# Patient Record
Sex: Male | Born: 2000 | Race: Black or African American | Hispanic: No | Marital: Single | Smoking: Never smoker
Health system: Southern US, Community
[De-identification: ages and names within clinical notes are randomized; demographics above are authoritative.]

## PROBLEM LIST (undated history)

## (undated) DIAGNOSIS — T7840XA Allergy, unspecified, initial encounter: Secondary | ICD-10-CM

## (undated) DIAGNOSIS — F909 Attention-deficit hyperactivity disorder, unspecified type: Secondary | ICD-10-CM

## (undated) HISTORY — PX: OTHER SURGICAL HISTORY: SHX169

---
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
## 2022-08-20 ENCOUNTER — Encounter (HOSPITAL_COMMUNITY): Payer: Self-pay

## 2022-08-20 ENCOUNTER — Ambulatory Visit (HOSPITAL_COMMUNITY)
Admission: EM | Admit: 2022-08-20 | Discharge: 2022-08-20 | Disposition: A | Discharge status: Home / Self Care | Payer: BC Managed Care – PPO | Attending: Physician Assistant | Admitting: Physician Assistant

## 2022-08-20 DIAGNOSIS — R002 Palpitations: Secondary | ICD-10-CM

## 2022-08-20 LAB — COMPREHENSIVE METABOLIC PANEL
ALT: 16 U/L (ref 0–44)
AST: 28 U/L (ref 15–41)
Albumin: 4.6 g/dL (ref 3.5–5.0)
Alkaline Phosphatase: 56 U/L (ref 38–126)
Anion gap: 11 (ref 5–15)
BUN: 20 mg/dL (ref 6–20)
CO2: 25 mmol/L (ref 22–32)
Calcium: 9.9 mg/dL (ref 8.9–10.3)
Chloride: 100 mmol/L (ref 98–111)
Creatinine, Ser: 1.05 mg/dL (ref 0.61–1.24)
GFR, Estimated: >60 mL/min (ref >60)
Glucose, Bld: 106 mg/dL — ABNORMAL HIGH (ref 70–99)
Potassium: 4.4 mmol/L (ref 3.5–5.1)
Sodium: 136 mmol/L (ref 135–145)
Total Bilirubin: 0.8 mg/dL (ref 0.3–1.2)
Total Protein: 7.5 g/dL (ref 6.5–8.1)

## 2022-08-20 LAB — POCT URINALYSIS DIP (MANUAL ENTRY)
Bilirubin, UA: NEGATIVE
Blood, UA: NEGATIVE
Glucose, UA: NEGATIVE mg/dL
Ketones, POC UA: NEGATIVE mg/dL
Leukocytes, UA: NEGATIVE
Nitrite, UA: NEGATIVE
Protein Ur, POC: NEGATIVE mg/dL
Spec Grav, UA: 1.025 (ref 1.010–1.025)
Urobilinogen, UA: 0.2 E.U./dL
pH, UA: 6 (ref 5.0–8.0)

## 2022-08-20 LAB — CBC
HCT: 43.7 % (ref 39.0–52.0)
Hemoglobin: 14.5 g/dL (ref 13.0–17.0)
MCH: 30.3 pg (ref 26.0–34.0)
MCHC: 33.2 g/dL (ref 30.0–36.0)
MCV: 91.2 fL (ref 80.0–100.0)
Platelets: 257 10*3/uL (ref 150–400)
RBC: 4.79 MIL/uL (ref 4.22–5.81)
RDW: 12.3 % (ref 11.5–15.5)
WBC: 5.6 10*3/uL (ref 4.0–10.5)
nRBC: 0 % (ref 0.0–0.2)

## 2022-08-20 LAB — TSH: TSH: 0.269 u[IU]/mL — ABNORMAL LOW (ref 0.350–4.500)

## 2022-08-20 NOTE — ED Provider Notes (Signed)
[facility]    CSN: [id number] Arrival date & time: 08/20/22  1211      History   Chief Complaint Chief Complaint  Patient presents with   Tachycardia    HPI Douglas Compton is a 22 y.o. male.   Patient presents today companied by his mother help provide the majority of history.  Reports that he woke up today and had a sensation of palpitations with associated mild chest discomfort and shortness of breath.  This lasted for several minutes up to a few hours but then resolved.  Reports generally feeling weak and not feeling well but denies any specific additional symptoms including cough, congestion, nausea, vomiting, chest pain, diaphoresis.  Does report that he took a CBD gummy yesterday before his symptoms began but has done this before without side effects or concern.  Denies additional medication changes or increase in caffeine consumption.  Denies past medical history including thyroid condition, anemia, arrhythmia.  Denies any bleeding including melena or hematochezia.  Denies any strenuous activity or significant water loss in the past few days.  He has been pushing fluids symptoms symptoms began with improvement.    Past Medical History:  Diagnosis Date   ADHD    Allergies     There are no problems to display for this patient.   Past Surgical History:  Procedure Laterality Date   adnoids         Home Medications    Prior to Admission medications   Medication Sig Start Date End Date Taking? Authorizing Provider  loratadine (CLARITIN) 10 MG tablet Take by mouth.   Yes [provider]  EPINEPHrine 0.3 mg/0.3 mL IJ SOAJ injection USE AS DIRECTED FOR SEVERE ALLERGIC REACTION 06/27/19   [provider]    Family History Family History  Problem Relation Age of Onset   Irregular heart beat Mother    Scoliosis Father    Bleeding Disorder Father     Social History Social History   Tobacco Use   Smoking status: Never   Smokeless  tobacco: Never  Substance Use Topics   Alcohol use: Never   Drug use: Never     Allergies   Peanut oil   Review of Systems Review of Systems  Constitutional:  Positive for activity change. Negative for appetite change, fatigue and fever.  Respiratory:  Negative for cough and shortness of breath (resolved).   Cardiovascular:  Negative for chest pain, palpitations (resolved) and leg swelling.  Gastrointestinal:  Negative for abdominal pain, diarrhea, nausea and vomiting.  Neurological:  Negative for dizziness, light-headedness and headaches.     Physical Exam Triage Vital Signs ED Triage Vitals  Enc Vitals Group     BP 08/20/22 1322 116/72     Pulse Rate 08/20/22 1322 (!) 104     Resp 08/20/22 1322 17     Temp 08/20/22 1322 98.2 F (36.8 C)     Temp Source 08/20/22 1322 Oral     SpO2 08/20/22 1322 98 %     Weight 08/20/22 1322 117 lb (53.1 kg)     Height 08/20/22 1322 5\' 7"  (1.702 m)     Head Circumference --      Peak Flow --      Pain Score 08/20/22 1321 0     Pain Loc --      Pain Edu? --      Excl. in [place]? --    No data found.  Updated Vital Signs BP 112/67 (BP Location:  Left Arm)   Pulse 73   Temp 98.2 F (36.8 C) (Oral)   Resp 16   Ht 5\' 7"  (1.702 m)   Wt 117 lb (53.1 kg)   SpO2 100%   BMI 18.32 kg/m   Visual Acuity Right Eye Distance:   Left Eye Distance:   Bilateral Distance:    Right Eye Near:   Left Eye Near:    Bilateral Near:     Physical Exam Vitals reviewed.  Constitutional:      General: He is awake.     Appearance: Normal appearance. He is well-developed. He is not ill-appearing.     Comments: Very pleasant male appears stated age in no acute distress sitting comfortably in exam room  HENT:     Head: Normocephalic and atraumatic.     Mouth/Throat:     Pharynx: No oropharyngeal exudate, posterior oropharyngeal erythema or uvula swelling.  Cardiovascular:     Rate and Rhythm: Normal rate and regular rhythm.     Heart sounds:  Normal heart sounds, S1 normal and S2 normal. No murmur heard. Pulmonary:     Effort: Pulmonary effort is normal.     Breath sounds: Normal breath sounds. No stridor. No wheezing, rhonchi or rales.     Comments: Clear to auscultation bilaterally Abdominal:     General: Bowel sounds are normal.     Palpations: Abdomen is soft.     Tenderness: There is no abdominal tenderness.  Neurological:     Mental Status: He is alert.  Psychiatric:        Behavior: Behavior is cooperative.      UC Treatments / Results  Labs (all labs ordered are listed, but only abnormal results are displayed) Labs Reviewed  COMPREHENSIVE METABOLIC PANEL  CBC  TSH  POCT URINALYSIS DIP (MANUAL ENTRY)    EKG   Radiology No results found.  Procedures Procedures (including critical care time)  Medications Ordered in UC Medications - No data to display  Initial Impression / Assessment and Plan / UC Course  I have reviewed the triage vital signs and the nursing notes.  Pertinent labs & imaging results that were available during my care of the patient were reviewed by me and considered in my medical decision making (see chart for details).     Patient is well-appearing, afebrile, nontoxic.  He was initially tachycardic on intake but this resolved by the time we had EKG performed.  EKG was performed given palpitations that showed normal sinus rhythm with ventricular rate of 69 bpm without ischemic changes; no previous to compare.  UA was obtained that was normal with no evidence of dehydration.  We discussed that his symptoms could be related to dehydration that is since resolved now that he has been pushing fluids.  Encouraged him to continue pushing fluids since this has provided relief of symptoms.  Basic labs including CBC, CMP, TSH were obtained.  Discussed that if he has any recurrent episodes he should follow-up with a cardiologist to consider Holter monitor and was given contact information for local  provider with instruction to call to schedule an appointment.  Discussed that if he has any severe symptoms including palpitations, chest pain, shortness of breath, weakness, syncope he needs to go to the emergency room.  Strict return precautions given.  Patient declined excuse note.  Final Clinical Impressions(s) / UC Diagnoses   Final diagnoses:  Palpitations     Discharge Instructions      Your EKG was reassuring.  You are not dehydrated.  I do suspect that you might have been dehydrated yesterday and this contributed to symptoms.  Make sure that you are drinking plenty of fluid.  We will contact you if lab work is abnormal.  If you have recurrent symptoms please call and schedule appoint with cardiology.  If you have any worsening symptoms including severe palpitations that lasts for more than a few minutes, chest pain, shortness of breath, weakness, passing out you need to go to the emergency room.     ED Prescriptions   None    PDMP not reviewed this encounter.   Jeani Hawking, PA-C 08/20/22 1513

## 2022-08-20 NOTE — ED Triage Notes (Signed)
Patient here today with c/o rapid heart rate X 1 hour. He has also been having some weakness today. He has been trying to relax since it started. Seems to have improved some but still feels like it's beating fast and still having weakness. Prior to the symptoms, he was lying down and got up to use the bathroom when it started. He states that his chest did hurt a little when it first started.

## 2022-08-20 NOTE — Discharge Instructions (Signed)
Your EKG was reassuring.  You are not dehydrated.  I do suspect that you might have been dehydrated yesterday and this contributed to symptoms.  Make sure that you are drinking plenty of fluid.  We will contact you if lab work is abnormal.  If you have recurrent symptoms please call and schedule appoint with cardiology.  If you have any worsening symptoms including severe palpitations that lasts for more than a few minutes, chest pain, shortness of breath, weakness, passing out you need to go to the emergency room.

## 2022-08-30 ENCOUNTER — Encounter: Payer: Self-pay | Admitting: General Practice

## 2022-09-23 ENCOUNTER — Emergency Department (HOSPITAL_COMMUNITY)
Admission: EM | Admit: 2022-09-23 | Discharge: 2022-09-23 | Disposition: A | Discharge status: Home / Self Care | Payer: BC Managed Care – PPO | Attending: Emergency Medicine | Admitting: Emergency Medicine

## 2022-09-23 ENCOUNTER — Encounter (HOSPITAL_COMMUNITY): Payer: Self-pay

## 2022-09-23 ENCOUNTER — Other Ambulatory Visit: Payer: Self-pay

## 2022-09-23 ENCOUNTER — Emergency Department (HOSPITAL_COMMUNITY): Payer: BC Managed Care – PPO

## 2022-09-23 DIAGNOSIS — R079 Chest pain, unspecified: Secondary | ICD-10-CM | POA: Diagnosis not present

## 2022-09-23 DIAGNOSIS — R002 Palpitations: Secondary | ICD-10-CM | POA: Insufficient documentation

## 2022-09-23 LAB — BASIC METABOLIC PANEL
Anion gap: 10 (ref 5–15)
BUN: 20 mg/dL (ref 6–20)
CO2: 24 mmol/L (ref 22–32)
Calcium: 9.3 mg/dL (ref 8.9–10.3)
Chloride: 103 mmol/L (ref 98–111)
Creatinine, Ser: 1.18 mg/dL (ref 0.61–1.24)
GFR, Estimated: >60 mL/min (ref >60)
Glucose, Bld: 151 mg/dL — ABNORMAL HIGH (ref 70–99)
Potassium: 3.5 mmol/L (ref 3.5–5.1)
Sodium: 137 mmol/L (ref 135–145)

## 2022-09-23 LAB — CBC
HCT: 42.5 % (ref 39.0–52.0)
Hemoglobin: 14.3 g/dL (ref 13.0–17.0)
MCH: 29.9 pg (ref 26.0–34.0)
MCHC: 33.6 g/dL (ref 30.0–36.0)
MCV: 88.9 fL (ref 80.0–100.0)
Platelets: 237 10*3/uL (ref 150–400)
RBC: 4.78 MIL/uL (ref 4.22–5.81)
RDW: 12.1 % (ref 11.5–15.5)
WBC: 10.8 10*3/uL — ABNORMAL HIGH (ref 4.0–10.5)
nRBC: 0 % (ref 0.0–0.2)

## 2022-09-23 LAB — MAGNESIUM: Magnesium: 2 mg/dL (ref 1.7–2.4)

## 2022-09-23 LAB — TSH: TSH: 1.893 u[IU]/mL (ref 0.350–4.500)

## 2022-09-23 LAB — TROPONIN I (HIGH SENSITIVITY): Troponin I (High Sensitivity): 9 ng/L (ref <18)

## 2022-09-23 MED ORDER — NAPROXEN 250 MG PO TABS
500.0000 mg | ORAL_TABLET | Freq: Once | ORAL | Status: AC
  Administered 2022-09-23: 500 mg via ORAL
  Filled 2022-09-23: qty 2

## 2022-09-23 MED ORDER — NAPROXEN 500 MG PO TABS: 500.0000 mg | ORAL_TABLET | Freq: Two times a day (BID) | ORAL | 0 refills | Status: DC

## 2022-09-23 NOTE — ED Triage Notes (Signed)
Pt reports heart palpitations woke him from sleep

## 2022-09-23 NOTE — Discharge Instructions (Addendum)
You were evaluated in the Emergency Department and after careful evaluation, we did not find any emergent condition requiring admission or further testing in the hospital.  Your exam/testing today is overall reassuring.  Follow-up with your primary care doctor to discuss your thyroid numbers.  Follow-up with cardiology to discuss your symptoms.  Use the Naprosyn anti-inflammatory twice daily for pain.  Please return to the Emergency Department if you experience any worsening of your condition.   Thank you for allowing [place] to be a part of your care.

## 2022-09-23 NOTE — ED Provider Notes (Signed)
  Physical Exam  BP 124/78   Pulse 69   Temp 98.3 F (36.8 C)   Resp 11   Ht 5\' 6"  (1.676 m)   Wt 54.4 kg   SpO2 100%   BMI 19.37 kg/m   Physical Exam  Procedures  Procedures  ED Course / MDM     Received care of patient from Dr. Orland Dec.  Please see his note for prior history, physical and care.  This is a 22 year old male who presents with concern for palpitations and chest pain.  EKG completed and personally abided by me and Dr. Orland Dec shows no evidence of acute arrhythmia.  Chest x-ray completed and evaluated by radiology shows no evidence of pneumonia, pneumothorax, cardiomegaly, or pulmonary edema.  He has normal bilateral upper and lower extremity pulses, low clinical suspicion for aortic dissection by history and exam.  He is low risk Wells and PERC negative and have low suspicion for pulmonary embolus.  Labs are completed and personally about interpreted by me show no significant electrolyte abnormalities, no anemia, TSH was completed which was within normal limits, and improved from June 1.  Troponin 9 after more than 6 hours of symptoms and have low suspicion for myocarditis or ACS.  Recommend continued follow-up with primary care doctor, and following up with cardiology as has already been scheduled.     Alvira Monday, MD 09/23/22 [phone]

## 2022-09-23 NOTE — ED Provider Notes (Signed)
[facility] Emergency Department Provider Note MRN:  [id number]  Arrival date & time: 09/23/22     Chief Complaint   Palpitations   History of Present Illness   Douglas Compton is a 22 y.o. year-old male with no pertinent past medical history presenting to the ED with chief complaint of palpitations.  Continued palpitations over the past month, getting more frequent.  Also having central sharp chest pain on occasion, also with occasional left shoulder pain.  Not worse with a deep breath.  Seems to be more common when laying flat at night, sometimes worse with activities and movement.  Denies any leg pain or swelling, no shortness of breath, no abdominal pain, no recent drug use.  Review of Systems  A thorough review of systems was obtained and all systems are negative except as noted in the HPI and PMH.   Patient's Health History    Past Medical History:  Diagnosis Date   ADHD    Allergies     Past Surgical History:  Procedure Laterality Date   adnoids      Family History  Problem Relation Age of Onset   Irregular heart beat Mother    Scoliosis Father    Bleeding Disorder Father     Social History   Socioeconomic History   Marital status: Single    Spouse name: Not on file   Number of children: Not on file   Years of education: Not on file   Highest education level: Not on file  Occupational History   Not on file  Tobacco Use   Smoking status: Never   Smokeless tobacco: Never  Substance and Sexual Activity   Alcohol use: Never   Drug use: Never   Sexual activity: Never  Other Topics Concern   Not on file  Social History Narrative   Not on file   Social Determinants of Health   Financial Resource Strain: Not on file  Food Insecurity: Not on file  Transportation Needs: Not on file  Physical Activity: Not on file  Stress: Not on file  Social Connections: Not on file  Intimate Partner Violence: Not on file     Physical Exam    Vitals:   09/23/22 0555 09/23/22 0558  BP: (!) 142/85   Pulse: 64   Resp:  14  Temp:    SpO2: 100%     CONSTITUTIONAL: Well-appearing, NAD NEURO/PSYCH:  Alert and oriented x 3, no focal deficits EYES:  eyes equal and reactive ENT/NECK:  no LAD, no JVD CARDIO: Regular rate, well-perfused, normal S1 and S2 PULM:  CTAB no wheezing or rhonchi GI/GU:  non-distended, non-tender MSK/SPINE:  No gross deformities, no edema SKIN:  no rash, atraumatic   *Additional and/or pertinent findings included in MDM below  Diagnostic and Interventional Summary    EKG Interpretation Date/Time:  Friday September 23 2022 05:21:37 EDT Ventricular Rate:  85 PR Interval:  146 QRS Duration:  96 QT Interval:  366 QTC Calculation: 435 R Axis:   85  Text Interpretation: Sinus rhythm with marked sinus arrhythmia Right atrial enlargement Borderline ECG When compared with ECG of 20-Aug-2022 14:12, PREVIOUS ECG IS PRESENT Confirmed by Kennis Carina [phone]) on 09/23/2022 6:12:38 AM       Labs Reviewed  CBC - Abnormal; Notable for the following components:      Result Value   WBC 10.8 (*)    All other components within normal limits  BASIC METABOLIC PANEL  TSH  MAGNESIUM  DG Chest 2 View    (Results Pending)    Medications  naproxen (NAPROSYN) tablet 500 mg (500 mg Oral Given 09/23/22 1610)     Procedures  /  Critical Care Procedures  ED Course and Medical Decision Making  Initial Impression and Ddx Continued palpitations, was seen at urgent care last month with basic labs drawn that were overall reassuring.  He had a minimally abnormal low TSH level and so hyperthyroidism is considered.  Other considerations include electrolyte disturbance, arrhythmia, stress, regarding the pain favoring either costochondritis or GERD.  No fever, pain is very atypical.  Pneumothorax considered as well.  PERC negative, highly doubt PE, highly doubt dissection or any other emergent cardiopulmonary process.  Past  medical/surgical history that increases complexity of ED encounter: None  Interpretation of Diagnostics I personally reviewed the EKG and my interpretation is as follows: Sinus rhythm  Labs pending  Patient Reassessment and Ultimate Disposition/Management     Anticipating discharge if labs reassuring and patient remains in sinus rhythm here in the emergency department.  They already have follow-up with PCP and cardiology.  Signed out to oncoming provider at shift change.  Patient management required discussion with the following services or consulting groups:  None  Complexity of Problems Addressed Acute illness or injury that poses threat of life of bodily function  Additional Data Reviewed and Analyzed Further history obtained from: Further history from spouse/family member  Additional Factors Impacting ED Encounter Risk None  Elmer Sow. Pilar Plate, MD [facility] Emergency Medicine [facility] [email or web address]  Final Clinical Impressions(s) / ED Diagnoses     ICD-10-CM   1. Palpitations  R00.2     2. Chest pain, unspecified type  R07.9       ED Discharge Orders          Ordered    naproxen (NAPROSYN) 500 MG tablet  2 times daily        09/23/22 9604             Discharge Instructions Discussed with and Provided to Patient:     Discharge Instructions      You were evaluated in the Emergency Department and after careful evaluation, we did not find any emergent condition requiring admission or further testing in the hospital.  Your exam/testing today is overall reassuring.  Follow-up with your primary care doctor to discuss your thyroid numbers.  Follow-up with cardiology to discuss your symptoms.  Use the Naprosyn anti-inflammatory twice daily for pain.  Please return to the Emergency Department if you experience any worsening of your condition.   Thank you for allowing [place] to be a part of your care.       Sabas Sous, MD 09/23/22  [phone]

## 2022-09-23 NOTE — ED Triage Notes (Signed)
Pt reports that he has been having palpitations for the past "few weeks" . Pt reports feeling light headed today which is new and having heart palpitations. Pt episodes of palpitations are increasing in frequency.

## 2022-10-19 ENCOUNTER — Encounter: Payer: Self-pay | Admitting: Internal Medicine

## 2022-10-19 ENCOUNTER — Ambulatory Visit: Payer: BC Managed Care – PPO | Attending: Internal Medicine | Admitting: Internal Medicine

## 2022-10-19 ENCOUNTER — Ambulatory Visit (INDEPENDENT_AMBULATORY_CARE_PROVIDER_SITE_OTHER): Payer: BC Managed Care – PPO

## 2022-10-19 VITALS — BP 108/66 | HR 62 | Ht 66.0 in | Wt 119.0 lb

## 2022-10-19 DIAGNOSIS — R002 Palpitations: Secondary | ICD-10-CM

## 2022-10-19 NOTE — Patient Instructions (Signed)
Medication Instructions:   *If you need a refill on your cardiac medications before your next appointment, please call your pharmacy*   Lab Work:  If you have labs (blood work) drawn today and your tests are completely normal, you will receive your results only by: MyChart Message (if you have MyChart) OR A paper copy in the mail If you have any lab test that is abnormal or we need to change your treatment, we will call you to review the results.   Testing/Procedures:  Douglas Compton- Long Term Monitor Instructions  Your physician has requested you wear a ZIO patch monitor for 14 days.  This is a single patch monitor. [employer] supplies one patch monitor per enrollment. Additional stickers are not available. Please do not apply patch if you will be having a Nuclear Stress Test,  Echocardiogram, Cardiac CT, MRI, or Chest Xray during the period you would be wearing the  monitor. The patch cannot be worn during these tests. You cannot remove and re-apply the  ZIO XT patch monitor.  Your ZIO patch monitor will be mailed 3 day USPS to your address on file. It may take 3-5 days  to receive your monitor after you have been enrolled.  Once you have received your monitor, please review the enclosed instructions. Your monitor  has already been registered assigning a specific monitor serial # to you.  Billing and Patient Assistance Program Information  We have supplied [employer] with any of your insurance information on file for billing purposes. [employer] offers a sliding scale Patient Assistance Program for patients that do not have  insurance, or whose insurance does not completely cover the cost of the ZIO monitor.  You must apply for the Patient Assistance Program to qualify for this discounted rate.  To apply, please call [employer] at [phone], select option 4, select option 2, ask to apply for  Patient Assistance Program. Meredeth Ide will ask your household income, and how many people  are in your  household. They will quote your out-of-pocket cost based on that information.  [employer] will also be able to set up a 75-month, interest-free payment plan if needed.  Applying the monitor   Shave hair from upper left chest.  Hold abrader disc by orange tab. Rub abrader in 40 strokes over the upper left chest as  indicated in your monitor instructions.  Clean area with 4 enclosed alcohol pads. Let dry.  Apply patch as indicated in monitor instructions. Patch will be placed under collarbone on left  side of chest with arrow pointing upward.  Rub patch adhesive wings for 2 minutes. Remove white label marked "1". Remove the white  label marked "2". Rub patch adhesive wings for 2 additional minutes.  While looking in a mirror, press and release button in center of patch. A small green light will  flash 3-4 times. This will be your only indicator that the monitor has been turned on.  Do not shower for the first 24 hours. You may shower after the first 24 hours.  Press the button if you feel a symptom. You will hear a small click. Record Date, Time and  Symptom in the Patient Logbook.  When you are ready to remove the patch, follow instructions on the last 2 pages of Patient  Logbook. Stick patch monitor onto the last page of Patient Logbook.  Place Patient Logbook in the blue and white box. Use locking tab on box and tape box closed  securely. The blue and white box has  prepaid postage on it. Please place it in the mailbox as  soon as possible. Your physician should have your test results approximately 7 days after the  monitor has been mailed back to [facility].  Call [facility] Customer Care at [phone] if you have questions regarding  your ZIO XT patch monitor. Call them immediately if you see an orange light blinking on your  monitor.  If your monitor falls off in less than 4 days, contact our Monitor department at [phone].  If your monitor becomes loose or falls off after  4 days call [employer] at [phone] for  suggestions on securing your monitor    Follow-Up: At [facility], you and your health needs are our priority.  As part of our continuing mission to provide you with exceptional heart care, we have created designated Provider Care Teams.  These Care Teams include your primary Cardiologist (physician) and Advanced Practice Providers (APPs -  Physician Assistants and Nurse Practitioners) who all work together to provide you with the care you need, when you need it.  We recommend signing up for the patient portal called "MyChart".  Sign up information is provided on this After Visit Summary.  MyChart is used to connect with patients for Virtual Visits (Telemedicine).  Patients are able to view lab/test results, encounter notes, upcoming appointments, etc.  Non-urgent messages can be sent to your provider as well.   To learn more about what you can do with MyChart, go to [email or web address].

## 2022-10-19 NOTE — Progress Notes (Unsigned)
Enrolled for [employer] to mail a ZIO XT long term holter monitor to the patients address on file.  

## 2022-10-19 NOTE — Progress Notes (Signed)
Cardiology Office Note   Date:  10/19/2022   ID:  Douglas Compton, DOB [birth year], MRN [id number]  PCP:  Velvet Bathe, MD  Cardiologist:   Dietrich Pates, MD   Patient presents for    History of Present Illness: Douglas Compton is a 22 y.o. male with a history of palpitations   The pt says the spells started in June 2024  He woke up one day  Was laying in bed when his heart started racing.   Associated with SOB   WEnt to ER   There in SR  Sent home  Second episode woke up from sleep with his heart already beating fast  Says his L arm hurt, he was SOB  Also complained of stomach pain.    Went to ER EKG SRLabs, XRay negative    Given naprosyn   Episode  Eased off      Since then he says he continues to have spells   Random   Associated with tightness in chest  Some lightheadedness but no syncope      When not having a spell feels OK  Active  LIfts weights at gym  Does not do aerobic exercise       Current Meds  Medication Sig   EPINEPHrine 0.3 mg/0.3 mL IJ SOAJ injection USE AS DIRECTED FOR SEVERE ALLERGIC REACTION   loratadine (CLARITIN) 10 MG tablet Take by mouth.   naproxen (NAPROSYN) 500 MG tablet Take 1 tablet (500 mg total) by mouth 2 (two) times daily.     Allergies:   Fish allergy and Peanut oil   Past Medical History:  Diagnosis Date   ADHD    Allergies     Past Surgical History:  Procedure Laterality Date   adnoids       Social History:  The patient  reports that he has never smoked. He has never used smokeless tobacco. He reports that he does not drink alcohol and does not use drugs.   Family History:  The patient's family history includes Bleeding Disorder in his father; Irregular heart beat in his mother; Scoliosis in his father.    ROS:  Please see the history of present illness. All other systems are reviewed and  Negative to the above problem except as noted.    PHYSICAL EXAM: VS:  BP 108/66   Pulse 62   Ht 5\' 6"  (1.676 m)   Wt 119 lb (54 kg)    SpO2 99%   BMI 19.21 kg/m   GEN: Thin 21 yoin no acute distress  HEENT: normal  Neck: no JVD, carotid bruit Cardiac: RRR; no murmur  No LE edema  Respiratory:  clear to auscultation bilaterally GI: soft, nontender, nondistended No hepatomegaly  MS: no deformity Moving all extremities   Skin: warm and dry, no rash Neuro:  Strength and sensation are intact Psych: euthymic mood, full affect   EKG:  EKG is not ordered today.   Lipid Panel No results found for: "CHOL", "TRIG", "HDL", "CHOLHDL", "VLDL", "LDLCALC", "LDLDIRECT"    Wt Readings from Last 3 Encounters:  10/19/22 119 lb (54 kg) (3%, Z= -1.87)*  09/23/22 120 lb (54.4 kg) (4%, Z= -1.80)*  08/20/22 117 lb (53.1 kg) (2%, Z= -1.99)*   * Growth percentiles are based on CDC (Boys, 2-20 Years) data.      ASSESSMENT AND PLAN:  1  Tachycardia  PT with onset of tachypalpitations over te past couple months   Associated with some chest discomfort, some  SOB, some lightheadedness   On 2 ER visits no arrhythmia documented Will set up for a Zio patch to try to capture     Keep active     Avoid stimulants  Stay hydrated.  Follow up based on results    Current medicines are reviewed at length with the patient today.  The patient does not have concerns regarding medicines.  Signed, Dietrich Pates, MD  10/19/2022 3:26 PM    [facility] [address] [place], [place], [place]  [address] Phone: [phone]; Fax: [phone]

## 2022-11-04 DIAGNOSIS — R002 Palpitations: Secondary | ICD-10-CM

## 2022-12-02 ENCOUNTER — Ambulatory Visit: Payer: BC Managed Care – PPO | Admitting: Family Medicine

## 2022-12-08 ENCOUNTER — Ambulatory Visit: Payer: BC Managed Care – PPO | Admitting: Family Medicine

## 2022-12-08 ENCOUNTER — Encounter: Payer: Self-pay | Admitting: Family Medicine

## 2022-12-08 VITALS — BP 100/80 | HR 62 | Temp 97.8°F | Ht 66.0 in | Wt 120.2 lb

## 2022-12-08 DIAGNOSIS — L309 Dermatitis, unspecified: Secondary | ICD-10-CM

## 2022-12-08 DIAGNOSIS — R002 Palpitations: Secondary | ICD-10-CM | POA: Insufficient documentation

## 2022-12-08 DIAGNOSIS — R21 Rash and other nonspecific skin eruption: Secondary | ICD-10-CM | POA: Diagnosis not present

## 2022-12-08 DIAGNOSIS — Z91010 Allergy to peanuts: Secondary | ICD-10-CM | POA: Diagnosis not present

## 2022-12-08 DIAGNOSIS — J302 Other seasonal allergic rhinitis: Secondary | ICD-10-CM | POA: Diagnosis not present

## 2022-12-08 NOTE — Assessment & Plan Note (Signed)
Well controlled with lidex cream prn falres.

## 2022-12-08 NOTE — Assessment & Plan Note (Signed)
Acute irritation at piercing on the right earlobe, no redness heat or discharge.  Improving some.  Appears most consistent with nickel allergy.  Recommending applying topical steroid twice daily.  Call if redness or discharge occurs.

## 2022-12-08 NOTE — Progress Notes (Signed)
Patient ID: Douglas Compton, male    DOB: Sep 09, 2000, 22 y.o.   MRN: [id number]  This visit was conducted in person.  BP 100/80   Pulse 62   Temp 97.8 F (36.6 C) (Temporal)   Ht 5\' 6"  (1.676 m)   Wt 120 lb 3.2 oz (54.5 kg)   SpO2 98%   BMI 19.40 kg/m    CC:  Chief Complaint  Patient presents with   Establish Care    Pt would like to discuss swelling in right ear. 4 days.     Subjective:   HPI: Douglas Compton is a 22 y.o. male presenting on 12/08/2022 for Establish Care (Pt would like to discuss swelling in right ear. 4 days. )   Previous PCP: Dr. Sheliah Hatch Last CPX: years     Reviewed ER visit for chest tightness, SOB and racing.  Feels better with  prilosec as needed... occ burning in chest after eating.  Followed by Dr. Tenny Craw cardiology for palpitations Reviewed last office visit note October 19, 2022   Zio monitor showed sinus tach.. recommended ECHO.   Exercise: once a week at gym.  Diet: moderate fast food.   New onset right ear pain red and warm, painful.  Applied neosporin, cold compresses and saline solutions.  40 % better.  No fever.    Relevant past medical, surgical, family and social history reviewed and updated as indicated. Interim medical history since our last visit reviewed. Allergies and medications reviewed and updated. Outpatient Medications Prior to Visit  Medication Sig Dispense Refill   EPINEPHrine 0.3 mg/0.3 mL IJ SOAJ injection USE AS DIRECTED FOR SEVERE ALLERGIC REACTION     fluocinonide cream (LIDEX) 0.05 % Apply 1 Application topically 2 (two) times daily.     loratadine (CLARITIN) 10 MG tablet Take by mouth.     naproxen (NAPROSYN) 500 MG tablet Take 1 tablet (500 mg total) by mouth 2 (two) times daily. 30 tablet 0   No facility-administered medications prior to visit.     Per HPI unless specifically indicated in ROS section below Review of Systems  Constitutional:  Negative for fatigue and fever.  HENT:  Negative for ear pain.    Eyes:  Negative for pain.  Respiratory:  Negative for cough and shortness of breath.   Cardiovascular:  Negative for chest pain, palpitations and leg swelling.  Gastrointestinal:  Negative for abdominal pain.  Genitourinary:  Negative for dysuria.  Musculoskeletal:  Negative for arthralgias.  Neurological:  Negative for syncope, light-headedness and headaches.  Psychiatric/Behavioral:  Negative for dysphoric mood.    Objective:  BP 100/80   Pulse 62   Temp 97.8 F (36.6 C) (Temporal)   Ht 5\' 6"  (1.676 m)   Wt 120 lb 3.2 oz (54.5 kg)   SpO2 98%   BMI 19.40 kg/m   Wt Readings from Last 3 Encounters:  12/08/22 120 lb 3.2 oz (54.5 kg) (4%, Z= -1.80)*  10/19/22 119 lb (54 kg) (3%, Z= -1.87)*  09/23/22 120 lb (54.4 kg) (4%, Z= -1.80)*   * Growth percentiles are based on CDC (Boys, 2-20 Years) data.      Physical Exam Constitutional:      Appearance: He is well-developed.  HENT:     Head: Normocephalic.     Right Ear: Hearing normal.     Left Ear: Hearing normal.     Ears:     Comments: Right external ear above piercing yet small amount of hyperpigmentation  Nose: Nose normal.     Mouth/Throat:     Mouth: Oropharynx is clear and moist and mucous membranes are normal.  Neck:     Thyroid: No thyroid mass or thyromegaly.     Vascular: No carotid bruit.     Trachea: Trachea normal.  Cardiovascular:     Rate and Rhythm: Normal rate and regular rhythm.     Pulses: Normal pulses.     Heart sounds: Heart sounds not distant. No murmur heard.    No friction rub. No gallop.     Comments: No peripheral edema Pulmonary:     Effort: Pulmonary effort is normal. No respiratory distress.     Breath sounds: Normal breath sounds.  Skin:    General: Skin is warm, dry and intact.     Findings: No rash.  Psychiatric:        Mood and Affect: Mood and affect normal.        Speech: Speech normal.        Behavior: Behavior normal.        Thought Content: Thought content normal.      Results for orders placed or performed during the hospital encounter of 09/23/22  Basic metabolic panel  Result Value Ref Range   Sodium 137 135 - 145 mmol/L   Potassium 3.5 3.5 - 5.1 mmol/L   Chloride 103 98 - 111 mmol/L   CO2 24 22 - 32 mmol/L   Glucose, Bld 151 (H) 70 - 99 mg/dL   BUN 20 6 - 20 mg/dL   Creatinine, Ser 4.09 0.61 - 1.24 mg/dL   Calcium 9.3 8.9 - 81.1 mg/dL   GFR, Estimated >91 >47 mL/min   Anion gap 10 5 - 15  CBC  Result Value Ref Range   WBC 10.8 (H) 4.0 - 10.5 K/uL   RBC 4.78 4.22 - 5.81 MIL/uL   Hemoglobin 14.3 13.0 - 17.0 g/dL   HCT 82.9 56.2 - 13.0 %   MCV 88.9 80.0 - 100.0 fL   MCH 29.9 26.0 - 34.0 pg   MCHC 33.6 30.0 - 36.0 g/dL   RDW 86.5 78.4 - 69.6 %   Platelets 237 150 - 400 K/uL   nRBC 0.0 0.0 - 0.2 %  TSH  Result Value Ref Range   TSH 1.893 0.350 - 4.500 uIU/mL  Magnesium  Result Value Ref Range   Magnesium 2.0 1.7 - 2.4 mg/dL  Troponin I (High Sensitivity)  Result Value Ref Range   Troponin I (High Sensitivity) 9 <18 ng/L    Assessment and Plan  Seasonal allergies  Peanut allergy  Eczema, unspecified type Assessment & Plan:  Well controlled with lidex cream prn falres.   Skin rash Assessment & Plan: Acute irritation at piercing on the right earlobe, no redness heat or discharge.  Improving some.  Appears most consistent with nickel allergy.  Recommending applying topical steroid twice daily.  Call if redness or discharge occurs.   Palpitations    No follow-ups on file.   Kerby Nora, MD

## 2022-12-19 ENCOUNTER — Other Ambulatory Visit: Payer: Self-pay

## 2022-12-19 DIAGNOSIS — R002 Palpitations: Secondary | ICD-10-CM

## 2023-01-06 ENCOUNTER — Other Ambulatory Visit (HOSPITAL_COMMUNITY): Payer: BC Managed Care – PPO

## 2023-01-13 ENCOUNTER — Ambulatory Visit (HOSPITAL_COMMUNITY): Payer: BC Managed Care – PPO | Attending: Internal Medicine

## 2023-01-13 DIAGNOSIS — R002 Palpitations: Secondary | ICD-10-CM | POA: Insufficient documentation

## 2023-01-13 DIAGNOSIS — R0602 Shortness of breath: Secondary | ICD-10-CM

## 2023-01-13 LAB — ECHOCARDIOGRAM COMPLETE
Area-P 1/2: 3.36 cm2
S' Lateral: 2.4 cm

## 2023-01-17 ENCOUNTER — Telehealth: Payer: Self-pay

## 2023-01-17 NOTE — Telephone Encounter (Signed)
I went over the pts Echo and monitor results and he verbalized understanding... he says his palpitations and any feeling of a racing heart beat has much improved.. he will continue to monitor and let us know how he is doing but he prefers to hold off on the beta blocker for now but will let us know if he would like [place] to send them in.

## 2023-01-17 NOTE — Telephone Encounter (Signed)
-----   Message from [place] sent at 01/16/2023  9:11 PM EDT ----- Echo shows heart is structurally normal Normal function of ventricles Normal valve function Is she still having palpitaitons/tachycardia ?  Monitor showed no signficant arrhythmias but pt could try low dose Toprol XL 12.5-25 mg    Stay hydrated

## 2023-12-11 ENCOUNTER — Encounter: Payer: Self-pay | Admitting: *Deleted

## 2023-12-12 ENCOUNTER — Ambulatory Visit (INDEPENDENT_AMBULATORY_CARE_PROVIDER_SITE_OTHER): Payer: BC Managed Care – PPO | Admitting: Family Medicine

## 2023-12-12 ENCOUNTER — Encounter: Payer: Self-pay | Admitting: Family Medicine

## 2023-12-12 VITALS — BP 110/70 | HR 93 | Temp 99.0°F | Ht 66.0 in | Wt 122.1 lb

## 2023-12-12 DIAGNOSIS — N4889 Other specified disorders of penis: Secondary | ICD-10-CM | POA: Diagnosis not present

## 2023-12-12 DIAGNOSIS — J392 Other diseases of pharynx: Secondary | ICD-10-CM | POA: Diagnosis not present

## 2023-12-12 DIAGNOSIS — Z0000 Encounter for general adult medical examination without abnormal findings: Secondary | ICD-10-CM | POA: Insufficient documentation

## 2023-12-12 DIAGNOSIS — Z113 Encounter for screening for infections with a predominantly sexual mode of transmission: Secondary | ICD-10-CM

## 2023-12-12 NOTE — Assessment & Plan Note (Signed)
 Acute, no lesion on exam. Most likely abrasion.  Will evaluate with HSV testing.  Of note patient with recent STI testing.  Will obtain from health department.

## 2023-12-12 NOTE — Assessment & Plan Note (Addendum)
 Patient with recurrent intermittent erythematous raised bumps in posterior oropharynx.  No ulcers.  Appearance most consistent with associated changes with chronic post nasal drip or viral infection. Patient had neck negative strep testing  at recent urgent care visit . Recommend Flonase 2 sprays per nostril daily and continuation of Zyrtec.  Less likely but also possible is gastroesophageal reflux as cause of irritation in throat.  Recommend decrease acid triggers in diet.  Continue proton pump inhibitor daily for 4 to 6 weeks.  Follow-up if not improving as expected.

## 2023-12-12 NOTE — Progress Notes (Signed)
 Patient ID: Douglas Compton, male    DOB: 08-27-2000, 23 y.o.   MRN: [id number]  This visit was conducted in person.  BP 110/70   Pulse 93   Temp 99 F (37.2 C) (Temporal)   Ht 5' 6 (1.676 m)   Wt 122 lb 2 oz (55.4 kg)   SpO2 98%   BMI 19.71 kg/m    CC:  Chief Complaint  Patient presents with   Annual Exam    Subjective:   HPI: The patient presents for complete physical and review of chronic health problems. He/She also has the following acute concerns today:  ST has been bothering him  for 1 month . Off and on.  No pain to swallow.  Always with PND, bad allergies causing nasal congestion. using Zyrtec , minimally helpful.  No nasal spray.  Has reflux once a day... using prilosec or antacids prn.  No cough.    No fever,    9/10 UC visit Tested for strep, GC/ Chlam  STI screening full at health Dept... will request records. Does have a slightly sore area that feel like it was irritaiton due to friction.                    New  male partner since 09/2023  Followed by Dr. Okey cardiology for palpitations  Zio monitor showed sinus tach..   Exercise: once a week at gym.  Diet: moderate fast food.  Rare ETOH     Relevant past medical, surgical, family and social history reviewed and updated as indicated. Interim medical history since our last visit reviewed. Allergies and medications reviewed and updated. Outpatient Medications Prior to Visit  Medication Sig Dispense Refill   chlorhexidine (PERIDEX) 0.12 % solution Use as directed in the mouth or throat 2 (two) times daily.     EPINEPHrine 0.3 mg/0.3 mL IJ SOAJ injection USE AS DIRECTED FOR SEVERE ALLERGIC REACTION     fluocinonide cream (LIDEX) 0.05 % Apply 1 Application topically 2 (two) times daily.     loratadine (CLARITIN) 10 MG tablet Take by mouth.     naproxen  (NAPROSYN ) 500 MG tablet Take 1 tablet (500 mg total) by mouth 2 (two) times daily. 30 tablet 0   No facility-administered medications prior to  visit.     Per HPI unless specifically indicated in ROS section below Review of Systems  Constitutional:  Negative for fatigue and fever.  HENT:  Positive for sore throat. Negative for ear pain.   Eyes:  Negative for pain.  Respiratory:  Negative for cough and shortness of breath.   Cardiovascular:  Negative for chest pain, palpitations and leg swelling.  Gastrointestinal:  Negative for abdominal pain.  Genitourinary:  Negative for dysuria.  Musculoskeletal:  Negative for arthralgias.  Neurological:  Negative for syncope, light-headedness and headaches.  Psychiatric/Behavioral:  Negative for dysphoric mood.    Objective:  BP 110/70   Pulse 93   Temp 99 F (37.2 C) (Temporal)   Ht 5' 6 (1.676 m)   Wt 122 lb 2 oz (55.4 kg)   SpO2 98%   BMI 19.71 kg/m   Wt Readings from Last 3 Encounters:  12/12/23 122 lb 2 oz (55.4 kg)  12/08/22 120 lb 3.2 oz (54.5 kg) (4%, Z= -1.80)*  10/19/22 119 lb (54 kg) (3%, Z= -1.87)*   * Growth percentiles are based on CDC (Boys, 2-20 Years) data.      Physical Exam Constitutional:  Appearance: He is well-developed.  HENT:     Head: Normocephalic.     Right Ear: Hearing normal.     Left Ear: Hearing normal.     Ears:     Comments: Right external ear above piercing yet small amount of hyperpigmentation    Nose: Nose normal.  Neck:     Thyroid : No thyroid  mass or thyromegaly.     Vascular: No carotid bruit.     Trachea: Trachea normal.  Cardiovascular:     Rate and Rhythm: Normal rate and regular rhythm.     Pulses: Normal pulses.     Heart sounds: Heart sounds not distant. No murmur heard.    No friction rub. No gallop.     Comments: No peripheral edema Pulmonary:     Effort: Pulmonary effort is normal. No respiratory distress.     Breath sounds: Normal breath sounds.  Skin:    General: Skin is warm and dry.     Findings: No rash.  Psychiatric:        Speech: Speech normal.        Behavior: Behavior normal.        Thought  Content: Thought content normal.     Results for orders placed or performed in visit on 01/13/23  ECHOCARDIOGRAM COMPLETE   Collection Time: 01/13/23  4:14 PM  Result Value Ref Range   Area-P 1/2 3.36 cm2   S' Lateral 2.40 cm   Est EF 60 - 65%     Assessment and Plan  Vaccines: Prostate Cancer Screen: not indicated Colon Cancer Screen:  no first degree family history of caner.      Smoking Status: non smoker ETOH/ drug use:  Hep C:  due  HIV screen:   tested 1 month ago  Routine general medical examination at a health care facility  Routine screening for STI (sexually transmitted infection) -     HSV(herpes simplex vrs) 1+2 ab-IgG  Oropharyngeal lesion Assessment & Plan: Patient with recurrent intermittent erythematous raised bumps in posterior oropharynx.  No ulcers.  Appearance most consistent with associated changes with chronic post nasal drip or viral infection. Patient had neck negative strep testing  at recent urgent care visit . Recommend Flonase 2 sprays per nostril daily and continuation of Zyrtec.  Less likely but also possible is gastroesophageal reflux as cause of irritation in throat.  Recommend decrease acid triggers in diet.  Continue proton pump inhibitor daily for 4 to 6 weeks.  Follow-up if not improving as expected.   Penile irritation Assessment & Plan: Acute, no lesion on exam. Most likely abrasion.  Will evaluate with HSV testing.  Of note patient with recent STI testing.  Will obtain from health department.      No follow-ups on file.   Greig Ring, MD

## 2023-12-13 LAB — HERPES SIMPLEX VIRUS 1 AND 2 (IGG),REFLEX HSV-2 INHIBITION
HSV 1 IGG,TYPE SPECIFIC AB: <0.9 {index}
HSV 2 IGG,TYPE SPECIFIC AB: <0.9 {index}

## 2023-12-14 ENCOUNTER — Ambulatory Visit: Payer: Self-pay | Admitting: Family Medicine

## 2024-04-18 ENCOUNTER — Telehealth: Payer: Self-pay | Admitting: *Deleted

## 2024-04-18 NOTE — Telephone Encounter (Signed)
 Please advise if you are willing to fill out paperwork?

## 2024-04-18 NOTE — Telephone Encounter (Signed)
 Copied from CRM [id number]. Topic: Clinical - Medical Advice >> Apr 18, 2024 10:07 AM Nessti S wrote: Reason for CRM: pt mom called because he was seen by telehealth for the flu. Pt job is requesting paperwork for leave of work from the flu. Telehealth states they dont fill out paperwork so pt mom is asking if pcp can fill out paperwork. Pt has documents from telehealth as well. She would like a call back soon as possible >> Apr 18, 2024 10:31 AM CMA Lila C wrote: Wrong office

## 2024-04-19 ENCOUNTER — Telehealth: Payer: Self-pay | Admitting: Family Medicine

## 2024-04-19 NOTE — Telephone Encounter (Signed)
 Spoke with Tonya and told her drop off form and E visit paperwork at front desk and Dr. Avelina will fill out for Douglas Compton.  I did ask that she write down dates he was out of work and the day he returned to work and bring that also when she drops off forms.

## 2024-04-19 NOTE — Telephone Encounter (Signed)
 Patient dropped off ppwk with dates as requested placed in Providers in box at front desk

## 2024-04-19 NOTE — Telephone Encounter (Signed)
 Error

## 2024-04-19 NOTE — Telephone Encounter (Signed)
 Forms completed and placed in Dr. Sherrel office in box to review and sign.

## 2024-04-19 NOTE — Telephone Encounter (Signed)
 Completed Forms faxed to [facility] [phone].

## 2024-04-26 NOTE — Telephone Encounter (Signed)
 Patient arrived in office to pick up forms

## 2024-04-26 NOTE — Telephone Encounter (Unsigned)
 Copied from CRM #[id number]. Topic: General - Other >> Apr 25, 2024  4:34 PM Delon DASEN wrote: Reason for CRM: checking on status of paperwork, advised they are ready for pick up

## 2024-04-26 NOTE — Telephone Encounter (Signed)
 Forms were faxed to [facility] on 04/19/2024.  I will print off a copy and leave up front for patient to pick up.
